# Patient Record
Sex: Male | Born: 1980 | Race: Black or African American | Hispanic: No | Marital: Single | Smoking: Current every day smoker
Health system: Southern US, Community
[De-identification: ages and names within clinical notes are randomized; demographics above are authoritative.]

---
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
## 2002-01-30 ENCOUNTER — Encounter: Payer: Self-pay | Admitting: Emergency Medicine

## 2002-01-30 ENCOUNTER — Emergency Department (HOSPITAL_COMMUNITY): Admission: EM | Admit: 2002-01-30 | Discharge: 2002-01-30 | Payer: Self-pay | Admitting: *Deleted

## 2002-02-05 ENCOUNTER — Encounter: Admission: RE | Admit: 2002-02-05 | Discharge: 2002-02-05 | Payer: Self-pay | Admitting: Internal Medicine

## 2002-02-23 ENCOUNTER — Emergency Department (HOSPITAL_COMMUNITY): Admission: EM | Admit: 2002-02-23 | Discharge: 2002-02-23 | Payer: Self-pay | Admitting: *Deleted

## 2009-03-11 ENCOUNTER — Emergency Department (HOSPITAL_COMMUNITY): Admission: EM | Admit: 2009-03-11 | Discharge: 2009-03-11 | Payer: Self-pay | Admitting: Emergency Medicine

## 2010-12-26 ENCOUNTER — Encounter: Payer: Self-pay | Admitting: Preventative Medicine

## 2012-04-30 ENCOUNTER — Emergency Department (HOSPITAL_COMMUNITY)
Admission: EM | Admit: 2012-04-30 | Discharge: 2012-04-30 | Disposition: A | Discharge status: Home / Self Care | Payer: Self-pay | Attending: Emergency Medicine | Admitting: Emergency Medicine

## 2012-04-30 ENCOUNTER — Encounter (HOSPITAL_COMMUNITY): Payer: Self-pay | Admitting: *Deleted

## 2012-04-30 ENCOUNTER — Emergency Department (HOSPITAL_COMMUNITY): Payer: Self-pay

## 2012-04-30 DIAGNOSIS — A084 Viral intestinal infection, unspecified: Secondary | ICD-10-CM

## 2012-04-30 DIAGNOSIS — R112 Nausea with vomiting, unspecified: Secondary | ICD-10-CM | POA: Insufficient documentation

## 2012-04-30 DIAGNOSIS — F17200 Nicotine dependence, unspecified, uncomplicated: Secondary | ICD-10-CM | POA: Insufficient documentation

## 2012-04-30 DIAGNOSIS — R197 Diarrhea, unspecified: Secondary | ICD-10-CM | POA: Insufficient documentation

## 2012-04-30 DIAGNOSIS — R61 Generalized hyperhidrosis: Secondary | ICD-10-CM | POA: Insufficient documentation

## 2012-04-30 DIAGNOSIS — A088 Other specified intestinal infections: Secondary | ICD-10-CM | POA: Insufficient documentation

## 2012-04-30 DIAGNOSIS — R109 Unspecified abdominal pain: Secondary | ICD-10-CM | POA: Insufficient documentation

## 2012-04-30 LAB — BASIC METABOLIC PANEL
BUN: 10 mg/dL (ref 6–23)
CO2: 26 mEq/L (ref 19–32)
Calcium: 9.9 mg/dL (ref 8.4–10.5)
Chloride: 102 mEq/L (ref 96–112)
Creatinine, Ser: 1.17 mg/dL (ref 0.50–1.35)
GFR calc Af Amer: >90 mL/min (ref >90)
GFR calc non Af Amer: 84 mL/min — ABNORMAL LOW (ref >90)
Glucose, Bld: 78 mg/dL (ref 70–99)
Potassium: 3.8 mEq/L (ref 3.5–5.1)
Sodium: 136 mEq/L (ref 135–145)

## 2012-04-30 LAB — DIFFERENTIAL
Basophils Absolute: 0 10*3/uL (ref 0.0–0.1)
Basophils Relative: 0 % (ref 0–1)
Eosinophils Absolute: 0.1 10*3/uL (ref 0.0–0.7)
Eosinophils Relative: 1 % (ref 0–5)
Lymphocytes Relative: 38 % (ref 12–46)
Lymphs Abs: 2.4 10*3/uL (ref 0.7–4.0)
Monocytes Absolute: 0.5 10*3/uL (ref 0.1–1.0)
Monocytes Relative: 9 % (ref 3–12)
Neutro Abs: 3.3 10*3/uL (ref 1.7–7.7)
Neutrophils Relative %: 52 % (ref 43–77)

## 2012-04-30 LAB — CBC
HCT: 41.2 % (ref 39.0–52.0)
Hemoglobin: 13.8 g/dL (ref 13.0–17.0)
MCH: 29.4 pg (ref 26.0–34.0)
MCHC: 33.5 g/dL (ref 30.0–36.0)
MCV: 87.7 fL (ref 78.0–100.0)
Platelets: 210 10*3/uL (ref 150–400)
RBC: 4.7 MIL/uL (ref 4.22–5.81)
RDW: 14.7 % (ref 11.5–15.5)
WBC: 6.3 10*3/uL (ref 4.0–10.5)

## 2012-04-30 LAB — URINALYSIS, ROUTINE W REFLEX MICROSCOPIC
Glucose, UA: NEGATIVE mg/dL
Hgb urine dipstick: NEGATIVE
Ketones, ur: NEGATIVE mg/dL
Leukocytes, UA: NEGATIVE
pH: 7 (ref 5.0–8.0)

## 2012-04-30 NOTE — ED Provider Notes (Signed)
History     CSN: [id number]  Arrival date & time 04/30/12  1309   First MD Initiated Contact with Patient 04/30/12 1603      Chief Complaint  Patient presents with  . Abdominal Pain    (Consider location/radiation/quality/duration/timing/severity/associated sxs/prior treatment) HPI Comments: Patient is a 31 year old man who said that he had eaten at a fast food restaurant last night. He didn't think is burger was cooked all the way through. Subsequently he developed stomach cramping, vomiting and diarrhea, which lasted all night into today. He therefore sought evaluation. His diarrhea has pretty much stopped though he has some small degree of abdominal cramping at present. He came to try to be sure nothing serious was wrong.  Patient is a 31 y.o. male presenting with abdominal pain.  Abdominal Pain The primary symptoms of the illness include abdominal pain, nausea, vomiting and diarrhea. The current episode started 13 to 24 hours ago. The onset of the illness was sudden. The problem has been gradually improving.  Associated with: Eating fast food. The patient has not had a change in bowel habit. Additional symptoms associated with the illness include diaphoresis. Associated medical issues comments: No prior GI problems.    History reviewed. No pertinent past medical history.  History reviewed. No pertinent past surgical history.  History reviewed. No pertinent family history.  History  Substance Use Topics  . Smoking status: Current Everyday Smoker  . Smokeless tobacco: Not on file  . Alcohol Use: Yes      Review of Systems  Constitutional: Positive for diaphoresis.  HENT: Negative.   Eyes: Negative.   Respiratory: Negative.   Gastrointestinal: Positive for nausea, vomiting, abdominal pain and diarrhea.  Genitourinary: Negative.   Musculoskeletal: Negative.   Neurological: Negative.   Psychiatric/Behavioral: Negative.     Allergies  Review of patient's allergies  indicates no known allergies.  Home Medications  No current outpatient prescriptions on file.  BP 138/81  Pulse 72  Temp(Src) 98.4 F (36.9 C) (Oral)  Resp 20  Ht 6\' 4"  (1.93 m)  Wt 270 lb (122.471 kg)  BMI 32.87 kg/m2  SpO2 100%  Physical Exam  Nursing note and vitals reviewed. Constitutional: He is oriented to person, place, and time.       Robust young man in mild distress with lower abdominal pain.  HENT:  Head: Normocephalic and atraumatic.  Right Ear: External ear normal.  Left Ear: External ear normal.  Mouth/Throat: Oropharynx is clear and moist.  Eyes: Conjunctivae and EOM are normal. Pupils are equal, round, and reactive to light. No scleral icterus.  Neck: Normal range of motion. Neck supple.  Cardiovascular: Normal rate, regular rhythm and normal heart sounds.   Pulmonary/Chest: Effort normal and breath sounds normal.  Abdominal: Soft. He exhibits no distension. There is no tenderness.       He localizes pain to his lower abdomen but there is no mass or tenderness to touch.  Musculoskeletal: Normal range of motion.  Neurological: He is alert and oriented to person, place, and time.       No sensory or motor deficit.  Skin: Skin is warm and dry.  Psychiatric: He has a normal mood and affect. His behavior is normal.    ED Course  Procedures (including critical care time)   Labs Reviewed  CBC  DIFFERENTIAL  BASIC METABOLIC PANEL  URINALYSIS, ROUTINE W REFLEX MICROSCOPIC    4:27 PM Patient was seen and had physical examination. Laboratory tests and acute abdominal x-ray series  were ordered.  7:18 PM Results for orders placed during the hospital encounter of 04/30/12  CBC      Component Value Range   WBC 6.3  4.0 - 10.5 (K/uL)   RBC 4.70  4.22 - 5.81 (MIL/uL)   Hemoglobin 13.8  13.0 - 17.0 (g/dL)   HCT 16.1  09.6 - 04.5 (%)   MCV 87.7  78.0 - 100.0 (fL)   MCH 29.4  26.0 - 34.0 (pg)   MCHC 33.5  30.0 - 36.0 (g/dL)   RDW 40.9  81.1 - 91.4 (%)    Platelets 210  150 - 400 (K/uL)  DIFFERENTIAL      Component Value Range   Neutrophils Relative 52  43 - 77 (%)   Neutro Abs 3.3  1.7 - 7.7 (K/uL)   Lymphocytes Relative 38  12 - 46 (%)   Lymphs Abs 2.4  0.7 - 4.0 (K/uL)   Monocytes Relative 9  3 - 12 (%)   Monocytes Absolute 0.5  0.1 - 1.0 (K/uL)   Eosinophils Relative 1  0 - 5 (%)   Eosinophils Absolute 0.1  0.0 - 0.7 (K/uL)   Basophils Relative 0  0 - 1 (%)   Basophils Absolute 0.0  0.0 - 0.1 (K/uL)  BASIC METABOLIC PANEL      Component Value Range   Sodium 136  135 - 145 (mEq/L)   Potassium 3.8  3.5 - 5.1 (mEq/L)   Chloride 102  96 - 112 (mEq/L)   CO2 26  19 - 32 (mEq/L)   Glucose, Bld 78  70 - 99 (mg/dL)   BUN 10  6 - 23 (mg/dL)   Creatinine, Ser 7.82  0.50 - 1.35 (mg/dL)   Calcium 9.9  8.4 - 95.6 (mg/dL)   GFR calc non Af Amer 84 (*) >90 (mL/min)   GFR calc Af Amer >90  >90 (mL/min)  URINALYSIS, ROUTINE W REFLEX MICROSCOPIC      Component Value Range   Color, Urine YELLOW  YELLOW    APPearance CLEAR  CLEAR    Specific Gravity, Urine 1.020  1.005 - 1.030    pH 7.0  5.0 - 8.0    Glucose, UA NEGATIVE  NEGATIVE (mg/dL)   Hgb urine dipstick NEGATIVE  NEGATIVE    Bilirubin Urine NEGATIVE  NEGATIVE    Ketones, ur NEGATIVE  NEGATIVE (mg/dL)   Protein, ur NEGATIVE  NEGATIVE (mg/dL)   Urobilinogen, UA 0.2  0.0 - 1.0 (mg/dL)   Nitrite NEGATIVE  NEGATIVE    Leukocytes, UA NEGATIVE  NEGATIVE    Dg Abd Acute W/chest  04/30/2012  *RADIOLOGY REPORT*  Clinical Data: Nausea/vomiting/diarrhea  ACUTE ABDOMEN SERIES (ABDOMEN 2 VIEW & CHEST 1 VIEW)  Comparison: None.  Findings: Lungs are clear. No pleural effusion or pneumothorax.  Cardiomediastinal silhouette is within normal limits.  Nonobstructive bowel gas pattern.  No evidence of free air under the diaphragm on the upright view.  Visualized osseous structures are within normal limits.  IMPRESSION: No evidence of acute cardiopulmonary disease.  No evidence of small bowel obstruction or  free air.  Original Report Authenticated By: Charline Bills, M.D.    7:18 PM Lab and x-ray testing was normal. Patient was reassured and released. He was advised to take clear liquids tonight, and try solid food tomorrow. 1. Viral gastroenteritis            Carleene Cooper III, MD 04/30/12 1919

## 2012-04-30 NOTE — ED Notes (Signed)
abd pain, NVD since last night. No fever

## 2012-04-30 NOTE — Discharge Instructions (Signed)
Jamie Cannon, and had physical examination, laboratory testing, x-ray of your chest and abdomen to check on you after you had abdominal pain vomiting and diarrhea starting yesterday. Fortunately your tests were all good. It is likely that your illnesses caused by a virus infection called gastroenteritis, which should resolve spontaneously.

## 2012-10-31 IMAGING — CR DG ABDOMEN ACUTE W/ 1V CHEST
4 series · 4 of 4 positions shown · non-contrast
Comparison: None.

CLINICAL DATA: Nausea/vomiting/diarrhea

ACUTE ABDOMEN SERIES (ABDOMEN 2 VIEW & CHEST 1 VIEW)

[view not recorded (1 of 4)]
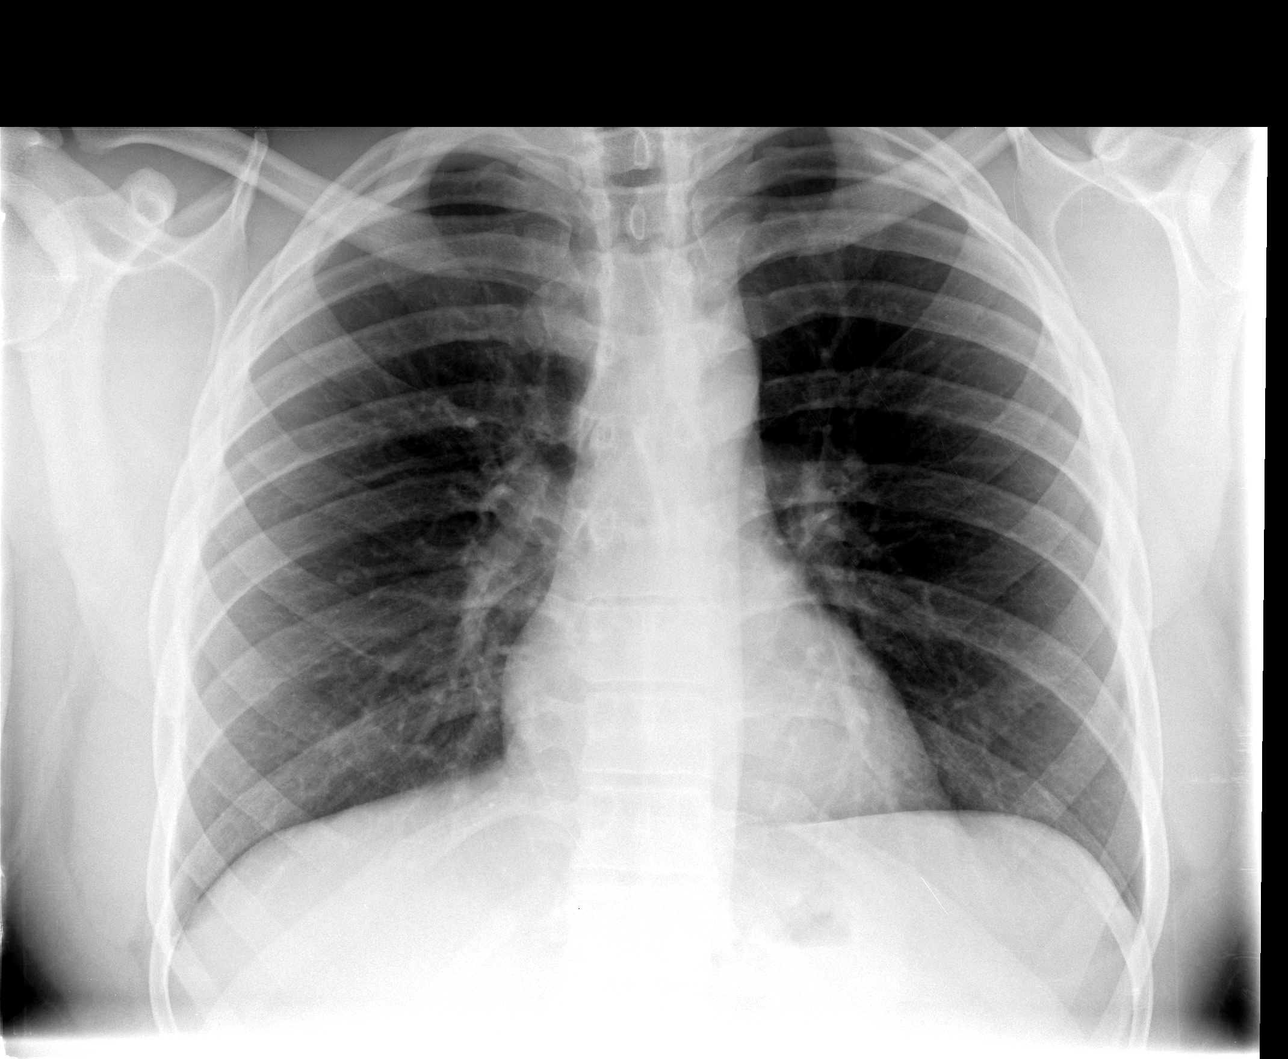

[view not recorded (2 of 4)]
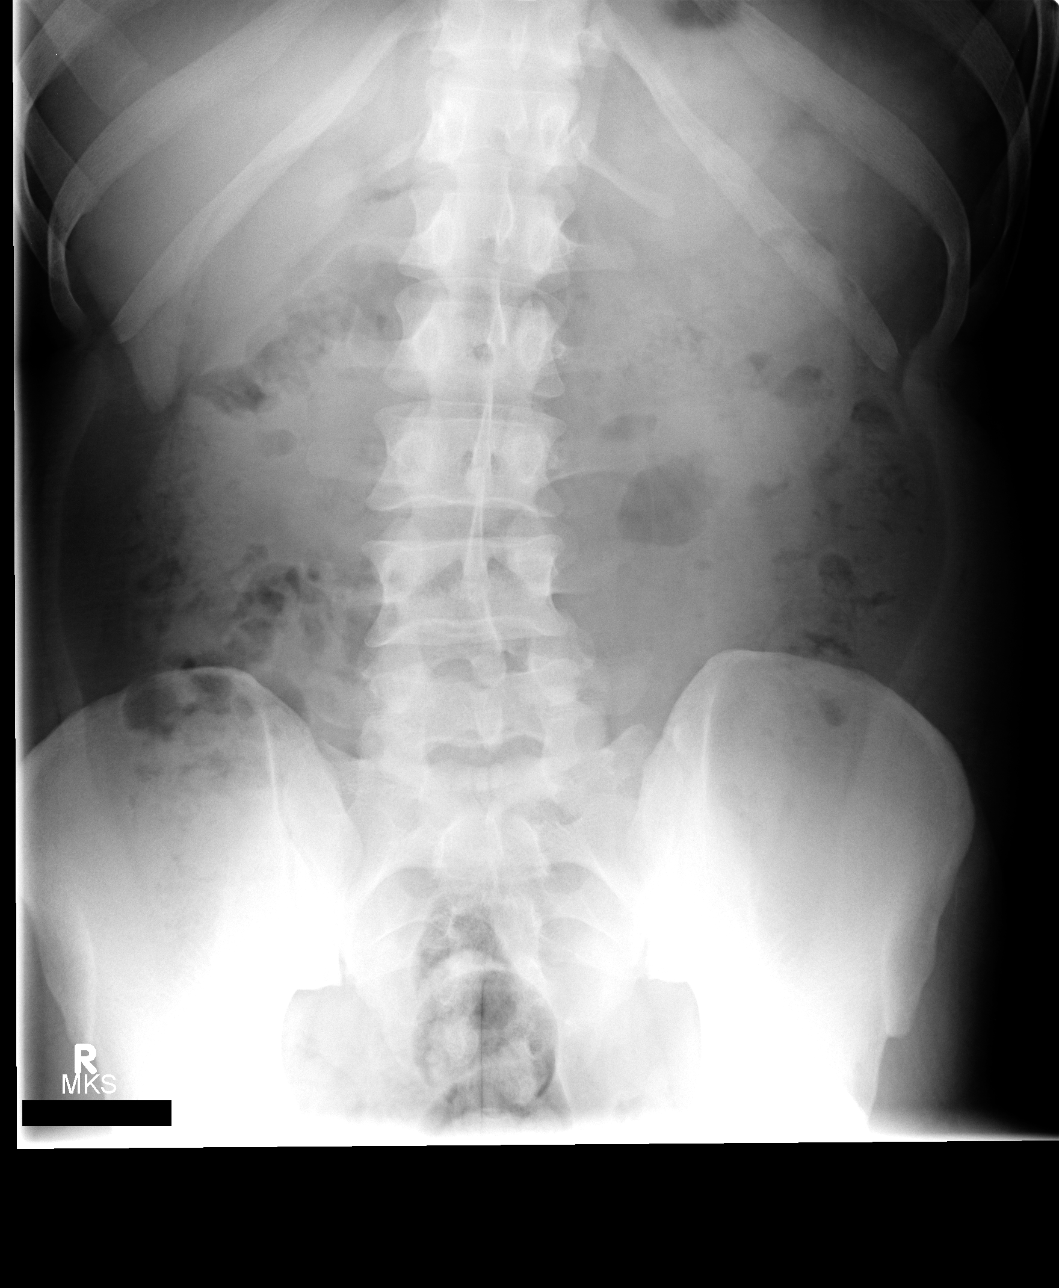

[view not recorded (3 of 4)]
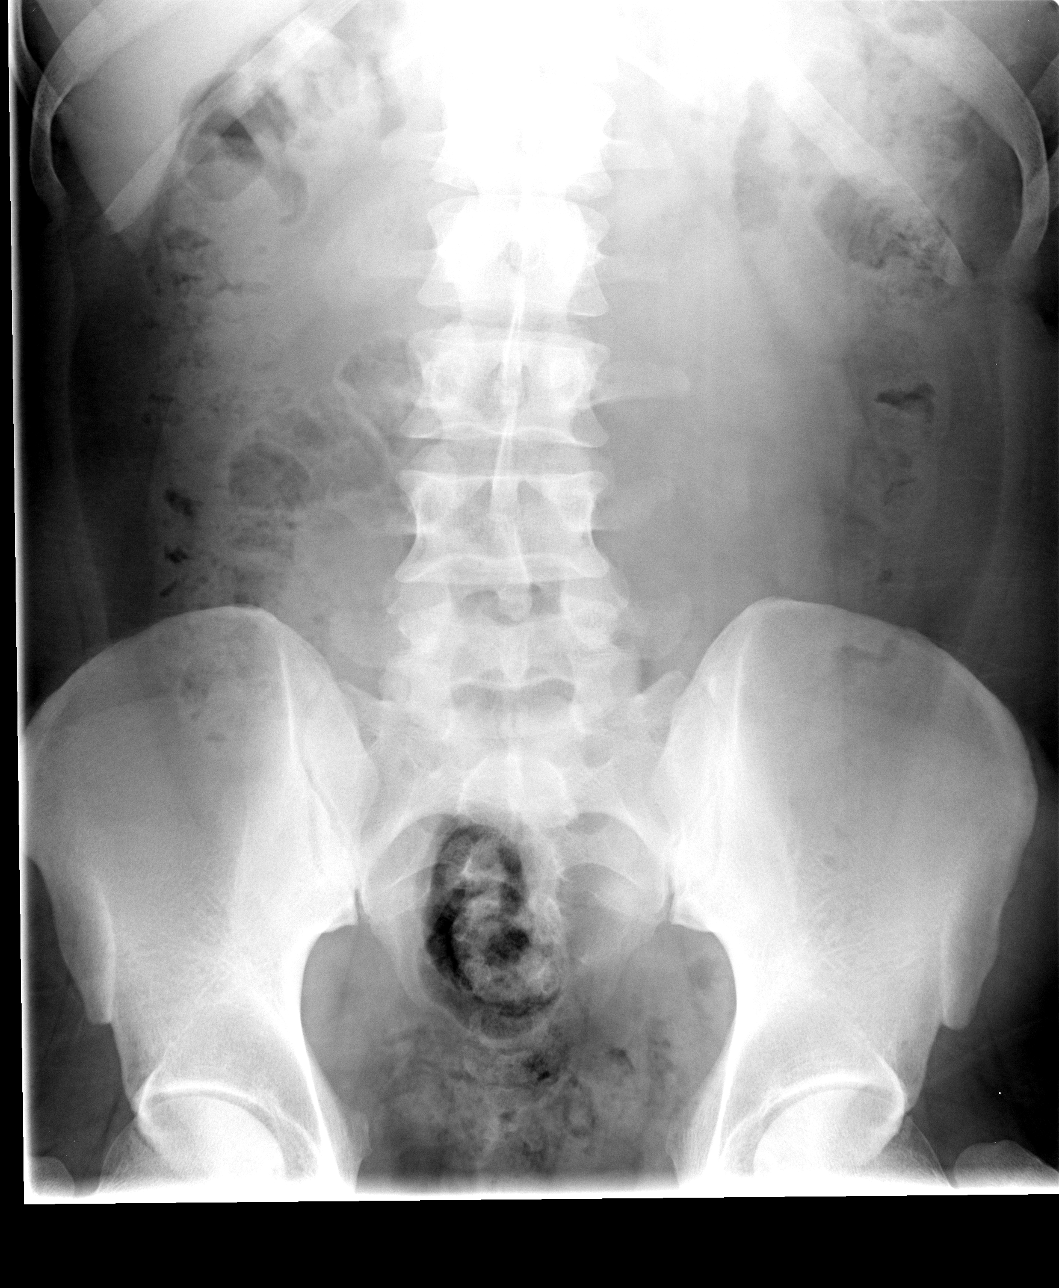

[view not recorded (4 of 4)]
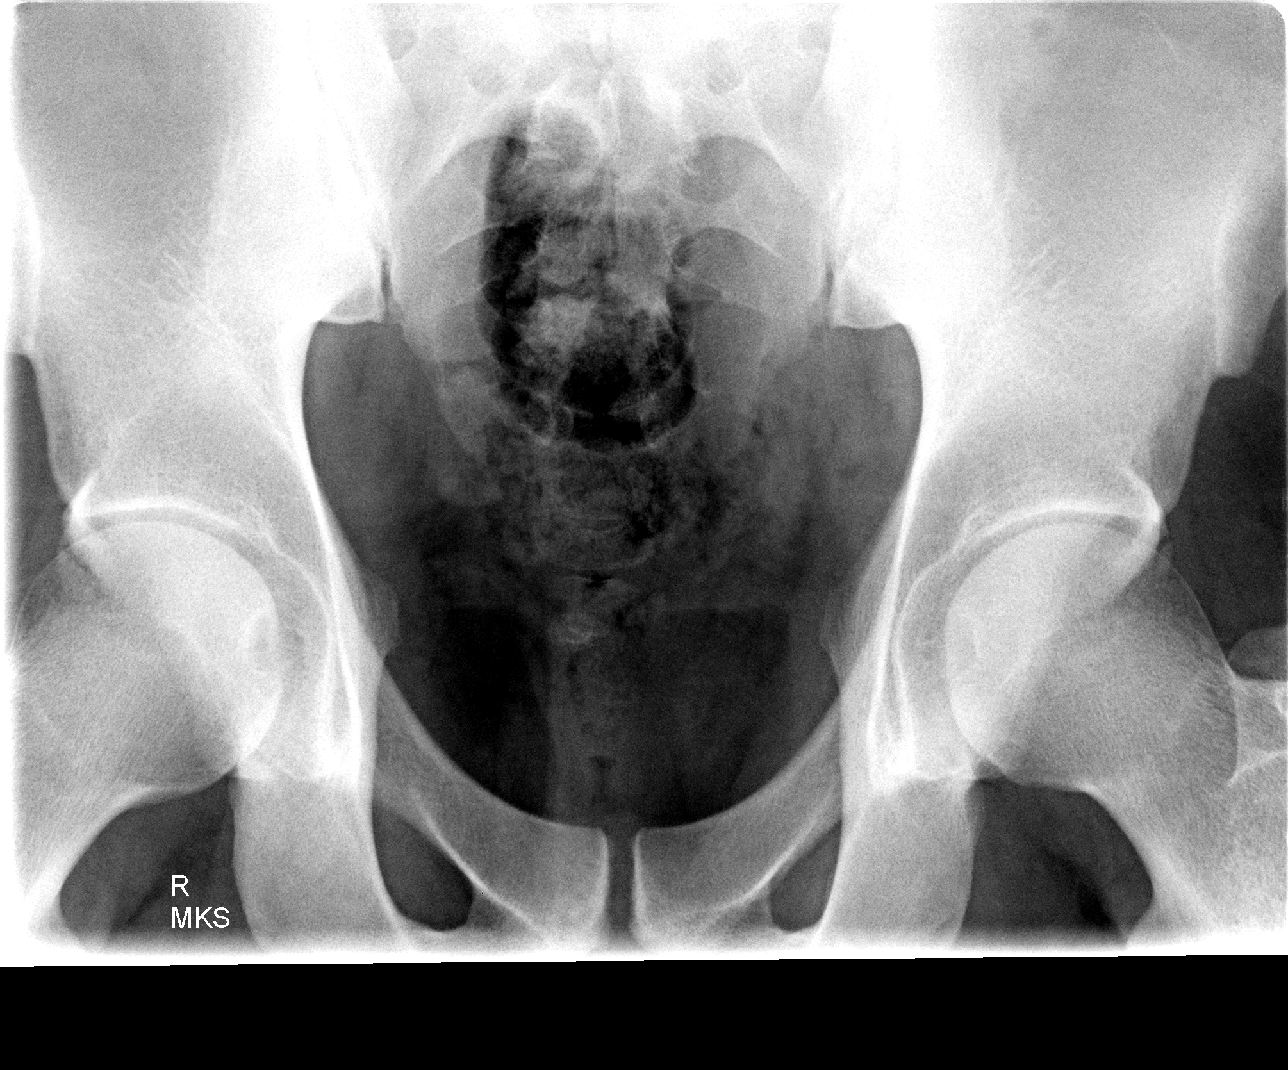

[4 of 4 positions shown; findings below may reference images not displayed]

FINDINGS: Lungs are clear. No pleural effusion or pneumothorax.

Cardiomediastinal silhouette is within normal limits.

Nonobstructive bowel gas pattern.

No evidence of free air under the diaphragm on the upright view.

Visualized osseous structures are within normal limits.
IMPRESSION: No evidence of acute cardiopulmonary disease.

No evidence of small bowel obstruction or free air.

## 2019-10-22 ENCOUNTER — Other Ambulatory Visit: Payer: Self-pay

## 2019-10-22 DIAGNOSIS — Z20822 Contact with and (suspected) exposure to covid-19: Secondary | ICD-10-CM

## 2019-10-24 LAB — NOVEL CORONAVIRUS, NAA: SARS-CoV-2, NAA: NOT DETECTED

## 2019-10-26 ENCOUNTER — Telehealth: Payer: Self-pay | Admitting: General Practice

## 2019-10-26 NOTE — Telephone Encounter (Signed)
Negative COVID results given. Patient results "NOT Detected." Caller expressed understanding. ° °

## 2020-07-15 ENCOUNTER — Other Ambulatory Visit: Payer: Self-pay

## 2020-07-15 ENCOUNTER — Other Ambulatory Visit: Payer: Self-pay | Admitting: Family Medicine

## 2020-07-15 ENCOUNTER — Ambulatory Visit: Payer: Self-pay

## 2020-07-15 DIAGNOSIS — Z0000 Encounter for general adult medical examination without abnormal findings: Secondary | ICD-10-CM

## 2024-05-03 ENCOUNTER — Encounter (HOSPITAL_COMMUNITY): Payer: Self-pay

## 2024-05-03 ENCOUNTER — Other Ambulatory Visit: Payer: Self-pay

## 2024-05-03 ENCOUNTER — Emergency Department (HOSPITAL_COMMUNITY)
Admission: EM | Admit: 2024-05-03 | Discharge: 2024-05-03 | Disposition: A | Discharge status: Home / Self Care | Attending: Emergency Medicine | Admitting: Emergency Medicine

## 2024-05-03 DIAGNOSIS — K625 Hemorrhage of anus and rectum: Secondary | ICD-10-CM | POA: Insufficient documentation

## 2024-05-03 LAB — COMPREHENSIVE METABOLIC PANEL WITH GFR
ALT: 16 U/L (ref 0–44)
AST: 19 U/L (ref 15–41)
Albumin: 4 g/dL (ref 3.5–5.0)
Alkaline Phosphatase: 59 U/L (ref 38–126)
Anion gap: 8 (ref 5–15)
BUN: 11 mg/dL (ref 6–20)
CO2: 27 mmol/L (ref 22–32)
Calcium: 8.9 mg/dL (ref 8.9–10.3)
Chloride: 105 mmol/L (ref 98–111)
Creatinine, Ser: 1.34 mg/dL — ABNORMAL HIGH (ref 0.61–1.24)
GFR, Estimated: >60 mL/min (ref >60)
Glucose, Bld: 108 mg/dL — ABNORMAL HIGH (ref 70–99)
Potassium: 3.8 mmol/L (ref 3.5–5.1)
Sodium: 140 mmol/L (ref 135–145)
Total Bilirubin: 0.7 mg/dL (ref 0.0–1.2)
Total Protein: 6.7 g/dL (ref 6.5–8.1)

## 2024-05-03 LAB — CBC
HCT: 40.2 % (ref 39.0–52.0)
Hemoglobin: 13.4 g/dL (ref 13.0–17.0)
MCH: 31.8 pg (ref 26.0–34.0)
MCHC: 33.3 g/dL (ref 30.0–36.0)
MCV: 95.3 fL (ref 80.0–100.0)
Platelets: 210 10*3/uL (ref 150–400)
RBC: 4.22 MIL/uL (ref 4.22–5.81)
RDW: 15.5 % (ref 11.5–15.5)
WBC: 5.9 10*3/uL (ref 4.0–10.5)
nRBC: 0 % (ref 0.0–0.2)

## 2024-05-03 LAB — TYPE AND SCREEN
ABO/RH(D): O POS
Antibody Screen: NEGATIVE

## 2024-05-03 LAB — POC OCCULT BLOOD, ED: Fecal Occult Blood: POSITIVE

## 2024-05-03 NOTE — ED Triage Notes (Signed)
 Pov form home. Cc of blood in stool. Dark red in color.  Once last night and once today. Also happened maybe 6 months ago. Says that his back started hurting a few days ago, unsure if it is connected. 6/10. Aching.

## 2024-05-03 NOTE — Discharge Instructions (Signed)
 Begin taking Metamucil, 1 heaping teaspoon in a glass of water 3 times daily.  Begin taking Colace (equal each stool softener) 100 mg twice daily.  Follow-up with gastroenterology in the next few days.  The contact information for Dr. Alita Irwin has been provided in this discharge summary for you to call and make these arrangements.  Return to the ER in the meantime if your symptoms significantly worsen or change.

## 2024-05-03 NOTE — ED Provider Notes (Signed)
 [place] [facility] AT [facility] Provider Note   CSN: [id number] Arrival date & time: 05/03/24  0865     History  Chief Complaint  Patient presents with   Rectal Bleeding    Jamie Cannon is a 43 y.o. male.  Patient is a 43 year old male presenting with complaints of rectal bleeding.  He reports 2 episodes of bright red blood per rectum that is noted when he wipes and in the toilet.  He denies any abdominal or rectal pain.  He denies any fevers or chills.  No dizziness or lightheadedness.       Home Medications Prior to Admission medications   Not on File      Allergies    Patient has no known allergies.    Review of Systems   Review of Systems  All other systems reviewed and are negative.   Physical Exam Updated Vital Signs BP (!) 150/96   Pulse 65   Temp 97.9 F (36.6 C) (Oral)   Resp 18   Ht 6\' 4"  (1.93 m)   Wt 117.9 kg   SpO2 99%   BMI 31.65 kg/m  Physical Exam Vitals and nursing note reviewed.  Constitutional:      General: He is not in acute distress.    Appearance: He is well-developed. He is not diaphoretic.  HENT:     Head: Normocephalic and atraumatic.  Cardiovascular:     Rate and Rhythm: Normal rate and regular rhythm.     Heart sounds: No murmur heard.    No friction rub.  Pulmonary:     Effort: Pulmonary effort is normal. No respiratory distress.     Breath sounds: Normal breath sounds. No wheezing or rales.  Abdominal:     General: Bowel sounds are normal. There is no distension.     Palpations: Abdomen is soft.     Tenderness: There is no abdominal tenderness.  Genitourinary:    Rectum: Normal.     Comments: No obvious lesions palpable or visible on rectal exam.  There is scant bright red blood noted. Musculoskeletal:        General: Normal range of motion.     Cervical back: Normal range of motion and neck supple.  Skin:    General: Skin is warm and dry.  Neurological:     Mental Status: He is alert  and oriented to person, place, and time.     Coordination: Coordination normal.     ED Results / Procedures / Treatments   Labs (all labs ordered are listed, but only abnormal results are displayed) Labs Reviewed  COMPREHENSIVE METABOLIC PANEL WITH GFR  CBC  POC OCCULT BLOOD, ED  TYPE AND SCREEN    EKG None  Radiology No results found.  Procedures Procedures    Medications Ordered in ED Medications - No data to display  ED Course/ Medical Decision Making/ A&P  Patient is a 43 year old male presenting with complaints of rectal bleeding as described in the HPI.  Patient arrives here with stable vital signs and is afebrile.  Rectal examination basically unremarkable and abdomen is benign.  Laboratory studies obtained including CBC and comprehensive metabolic panel, both of which are unremarkable.  Hemoglobin is stable at 13.4 with no white count.  Because of the patient's bleeding noted, but likely related to a fissure or internal hemorrhoid, or possibly more serious pathology.  I will have the patient take stool softeners, fiber supplementation, and follow-up with gastroenterology.  To return in  the meantime if symptoms significantly worsen.  Final Clinical Impression(s) / ED Diagnoses Final diagnoses:  None    Rx / DC Orders ED Discharge Orders     None         Orvilla Blander, MD 05/03/24 [phone]
